# Patient Record
Sex: Male | Born: 1990 | Race: White | Hispanic: No | Marital: Single | Smoking: Current some day smoker
Health system: Southern US, Community
[De-identification: ages and names within clinical notes are randomized; demographics above are authoritative.]

## PROBLEM LIST (undated history)

## (undated) DIAGNOSIS — S83209A Unspecified tear of unspecified meniscus, current injury, unspecified knee, initial encounter: Secondary | ICD-10-CM

## (undated) DIAGNOSIS — K429 Umbilical hernia without obstruction or gangrene: Secondary | ICD-10-CM

## (undated) HISTORY — PX: WISDOM TOOTH EXTRACTION: SHX21

---

## 2016-08-29 ENCOUNTER — Other Ambulatory Visit: Payer: Self-pay | Admitting: Family Medicine

## 2016-08-29 DIAGNOSIS — Z77018 Contact with and (suspected) exposure to other hazardous metals: Secondary | ICD-10-CM

## 2016-08-29 DIAGNOSIS — M25562 Pain in left knee: Secondary | ICD-10-CM

## 2016-09-02 ENCOUNTER — Other Ambulatory Visit: Payer: Self-pay

## 2016-09-07 ENCOUNTER — Other Ambulatory Visit: Payer: Self-pay

## 2016-09-14 ENCOUNTER — Encounter (INDEPENDENT_AMBULATORY_CARE_PROVIDER_SITE_OTHER): Payer: Self-pay | Admitting: Orthopedic Surgery

## 2016-09-14 ENCOUNTER — Ambulatory Visit (INDEPENDENT_AMBULATORY_CARE_PROVIDER_SITE_OTHER): Payer: BC Managed Care – PPO | Admitting: Orthopedic Surgery

## 2016-09-14 ENCOUNTER — Ambulatory Visit (INDEPENDENT_AMBULATORY_CARE_PROVIDER_SITE_OTHER): Payer: Self-pay

## 2016-09-14 DIAGNOSIS — M25562 Pain in left knee: Secondary | ICD-10-CM | POA: Diagnosis not present

## 2016-09-14 DIAGNOSIS — G8929 Other chronic pain: Secondary | ICD-10-CM

## 2016-09-14 NOTE — Progress Notes (Signed)
Office Visit Note   Patient: David Patrick           Date of Birth: 17-Feb-1991           MRN: [id number] Visit Date: 09/14/2016 Requested by: Blair Heys, MD [address] [place], [place] [address] PCP: Thora Lance, MD  Subjective: Chief Complaint  Patient presents with  . 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  He describes developing a mass on the posterior medial aspect of his knee.  He does do a lot of knee flexion activities.  He wears a brace which helps.  He describes weakness with occasional buckling.  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  Not taking any medication for the problem.  He states it has been bothering him significantly over the past 2 months.  He's to skateboard but doesn't do that much anymore.  He's not had any recent travel but he is a smoker              Review of Systems All systems reviewed are negative as they relate to the chief complaint within the history of present illness.  Patient denies  fevers or chills.    Assessment & Plan: Visit Diagnoses:  1. Chronic pain of left knee     Plan: Impression is left knee pain with likely medial meniscal tear with meniscal cyst.  This is generally confirmed with ultrasound.  I would like to get an MRI scan if possible to further confirm the nature of the meniscal tear and its repairability.  I think he is likely heading for meniscal cyst excision and partial meniscectomy with cars and tunnel cleavage tear closure.  The cyst does have septations but does not appear to have much in terms of a solid component.  I'll see him back after the MRI scan.  I do want to order I x-rays prior to obtaining the scan because of his exposure to metal in his sculpting work  Follow-Up Instructions: No Follow-up on file.   Orders:  Orders Placed This Encounter  Procedures  .  XR KNEE 3 VIEW LEFT   No orders of the defined types were placed in this encounter.     Procedures: No procedures performed      Clinical Data: No additional findings.  Objective: Vital Signs: There were no vitals taken for this visit.  Physical Exam   Constitutional: Patient appears well-developed HEENT:  Head: Normocephalic Eyes:EOM are normal Neck: Normal range of motion Cardiovascular: Normal rate Pulmonary/chest: Effort normal Neurologic: Patient is alert Skin: Skin is warm Psychiatric: Patient has normal mood and affect    Ortho Exam examination of the left knee demonstrates no effusion full range of motion intact extensor mechanism palpable pedal pulses he does have Walnut-sized mass posterior medial aspect of the knee at the level of the joint line and collateral crucial ligaments are stable femur compression testing equivocal no groin pain with internal/external rotation of the leg no other masses lymph adenopathy or skin changes noted other than that posterior medial mass which is cystic in nature  Specialty Comments:  No specialty comments available.  Imaging: Xr Knee 3 View Left  Result Date: 09/14/2016 AP lateral merchant left knee reviewed.  Patellofemoral joint maintained with no abnormal patellar tracking mechanics no arthritis is present alignment normal  no other soft tissue calcifications.  Joint space is maintained medial lateral side.    PMFS History: There are no active problems to display for this patient.  No past medical history on file.  No family history on file.  No past surgical history on file. Social History   Occupational History  . Not on file.   Social History Main Topics  . Smoking status: Current Every Day Smoker  . Smokeless tobacco: Never Used  . Alcohol use Yes  . Drug use: Unknown  . Sexual activity: Not on file

## 2016-09-26 ENCOUNTER — Other Ambulatory Visit (INDEPENDENT_AMBULATORY_CARE_PROVIDER_SITE_OTHER): Payer: Self-pay | Admitting: Orthopedic Surgery

## 2016-09-26 DIAGNOSIS — Z77018 Contact with and (suspected) exposure to other hazardous metals: Secondary | ICD-10-CM

## 2016-09-28 ENCOUNTER — Ambulatory Visit
Admission: RE | Admit: 2016-09-28 | Discharge: 2016-09-28 | Disposition: A | Discharge status: Home / Self Care | Payer: BC Managed Care – PPO | Source: Ambulatory Visit | Attending: Orthopedic Surgery | Admitting: Orthopedic Surgery

## 2016-09-28 ENCOUNTER — Ambulatory Visit (INDEPENDENT_AMBULATORY_CARE_PROVIDER_SITE_OTHER): Payer: BC Managed Care – PPO | Admitting: Orthopedic Surgery

## 2016-09-28 DIAGNOSIS — Z77018 Contact with and (suspected) exposure to other hazardous metals: Secondary | ICD-10-CM

## 2016-09-28 DIAGNOSIS — M25562 Pain in left knee: Principal | ICD-10-CM

## 2016-09-28 DIAGNOSIS — G8929 Other chronic pain: Secondary | ICD-10-CM

## 2016-10-03 ENCOUNTER — Encounter (INDEPENDENT_AMBULATORY_CARE_PROVIDER_SITE_OTHER): Payer: Self-pay | Admitting: Orthopedic Surgery

## 2016-10-03 ENCOUNTER — Ambulatory Visit (INDEPENDENT_AMBULATORY_CARE_PROVIDER_SITE_OTHER): Payer: BC Managed Care – PPO | Admitting: Orthopedic Surgery

## 2016-10-03 DIAGNOSIS — S83242A Other tear of medial meniscus, current injury, left knee, initial encounter: Secondary | ICD-10-CM | POA: Diagnosis not present

## 2016-10-03 NOTE — Progress Notes (Signed)
   Office Visit Note   Patient: David Patrick           Date of Birth: [birth year]           MRN: [id number] Visit Date: 10/03/2016 Requested by: Blair Heysobert Ehinger, MD [address] [place], [place] [address] PCP: Thora LanceEHINGER,ROBERT R, MD  Subjective: Chief Complaint  Patient presents with  . Left Knee - Pain, Follow-up    HPI David Patrick is a 26 year old patient with left knee pain.  Since that seems had an MRI scan which is reviewed with him today.  Does show horizontal cleavage-type medial meniscal tear with a very large meniscal cyst which is anterior and posterior to his MCL and extending a little bit posterior to the PCL.  He does do sculpting teaching along with skateboarding.  He denies much in the way of mechanical symptoms but does report pain.              Review of Systems All systems reviewed are negative as they relate to the chief complaint within the history of present illness.  Patient denies  fevers or chills.    Assessment & Plan: Visit Diagnoses:  1. Acute medial meniscus tear of left knee, initial encounter     Plan: Impression is left knee pain with medial meniscal cyst.  This is a ganglion cyst.  Plan is surgical decompression when he feels like he wants to do that.  We talked about needle aspiration today under ultrasound guidance but he wants to avoid that intervention.  I think in general he would need arthroscopy followed by open excision of that ganglion cyst and suture closure of the meniscal tear from the posterior capsular aspect of the knee.  Time out of work discussed risks and benefits discussed he'll consider his options and call to schedule if he wants to.  Otherwise we will see him back as needed  Follow-Up Instructions: No Follow-up on file.   Orders:  No orders of the defined types were placed in this encounter.  No orders of the defined types were placed in this encounter.     Procedures: No procedures performed   Clinical Data: No  additional findings.  Objective: Vital Signs: There were no vitals taken for this visit.  Physical Exam   Constitutional: Patient appears well-developed HEENT:  Head: Normocephalic Eyes:EOM are normal Neck: Normal range of motion Cardiovascular: Normal rate Pulmonary/chest: Effort normal Neurologic: Patient is alert Skin: Skin is warm Psychiatric: Patient has normal mood and affect    Ortho Exam left knee demonstrates palpable cystic structure posterior medial joint line and range of motion is full there is no effusion since her mechanism is intact collateral cruciate ligaments are stable pedal pulses palpable no other masses lymph and every skin changes noted in the left knee region  Specialty Comments:  No specialty comments available.  Imaging: No results found.   PMFS History: Patient Active Problem List   Diagnosis Date Noted  . Acute medial meniscus tear of left knee 10/03/2016   No past medical history on file.  No family history on file.  No past surgical history on file. Social History   Occupational History  . Not on file.   Social History Main Topics  . Smoking status: Current Every Day Smoker  . Smokeless tobacco: Never Used  . Alcohol use Yes  . Drug use: Unknown  . Sexual activity: Not on file

## 2017-09-26 IMAGING — MR MR KNEE*L* W/O CM
5 of 6 series · 33 of 40 positions shown · non-contrast
Comparison: Knee radiographs 09/14/2016

CLINICAL DATA: Chronic knee pain worsening over the past few
months. Palpable joint line abnormality.

EXAM:
MRI OF THE LEFT KNEE WITHOUT CONTRAST
TECHNIQUE: Multiplanar, multisequence MR imaging of the knee was performed. No
intravenous contrast was administered.

[Series 6: PD fat-sat · axial · left · 3.0mm · 0.39mm/px · z∈[-35,+78]mm · 8 of 36 slices shown (1 of 3)]
[im 1/36]
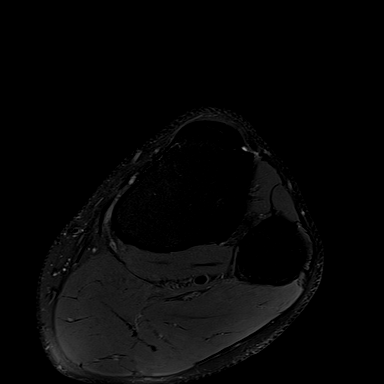
[im 6/36]
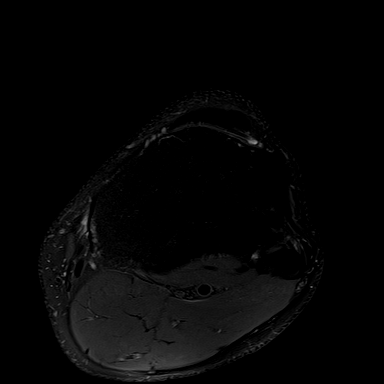
[im 11/36]
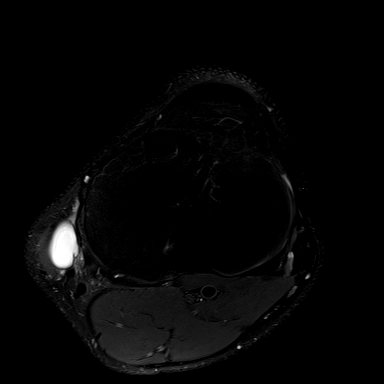
[im 16/36]
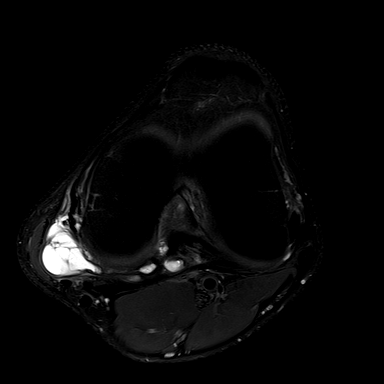
[im 21/36]
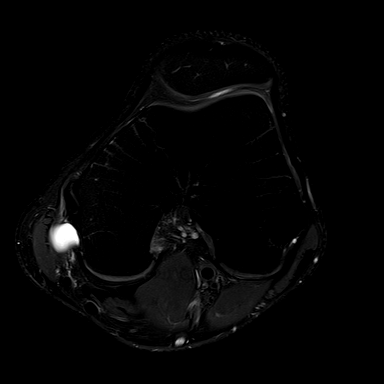
[im 26/36]
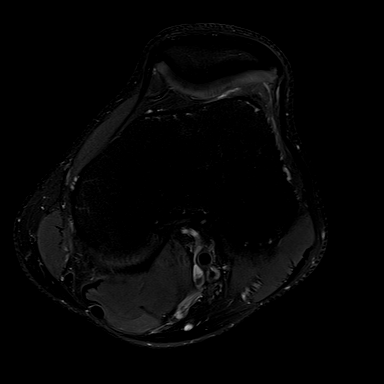
[im 31/36]
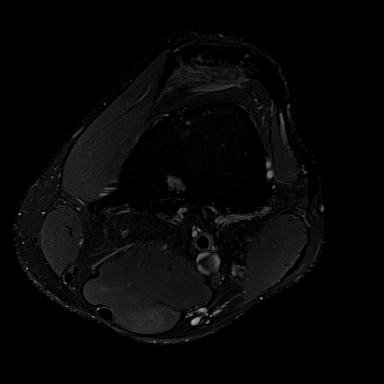
[im 36/36]
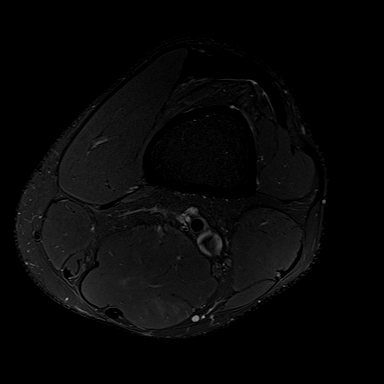

[Series 8: PD fat-sat · coronal · left · 3.0mm · 0.33mm/px · 7 of 33 slices shown (2 of 3)]
[im 1/33]
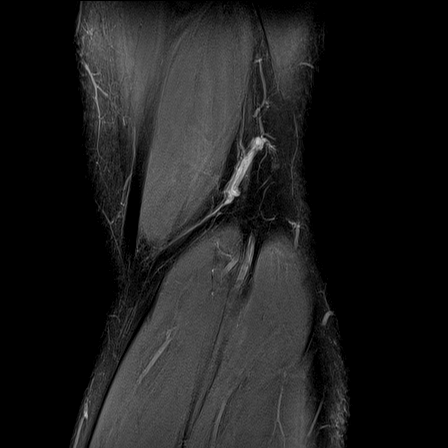
[im 6/33]
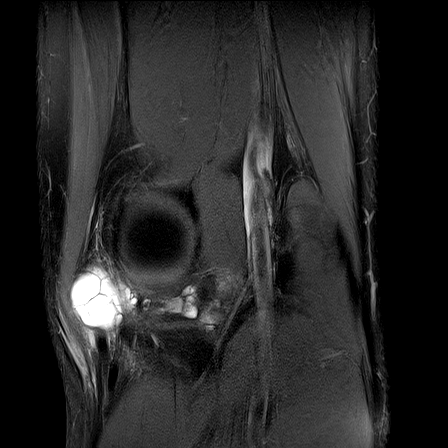
[im 11/33]
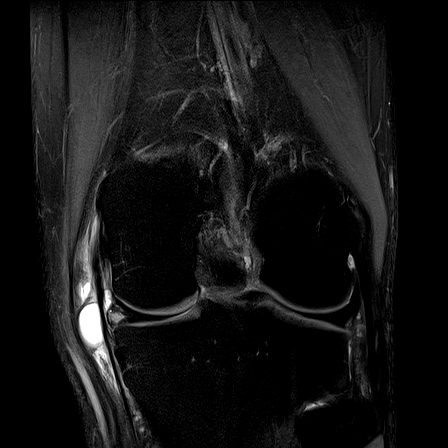
[im 17/33]
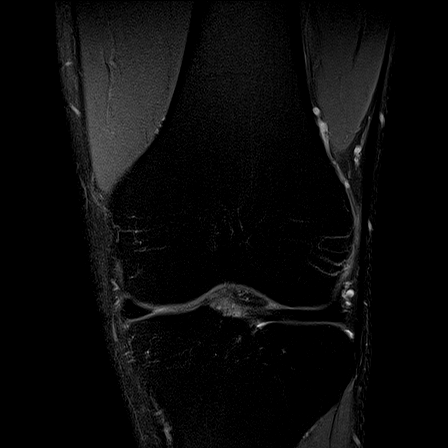
[im 22/33]
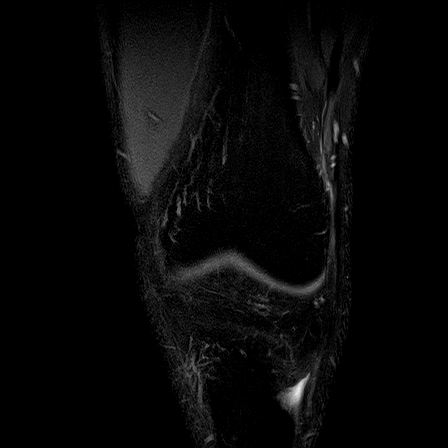
[im 27/33]
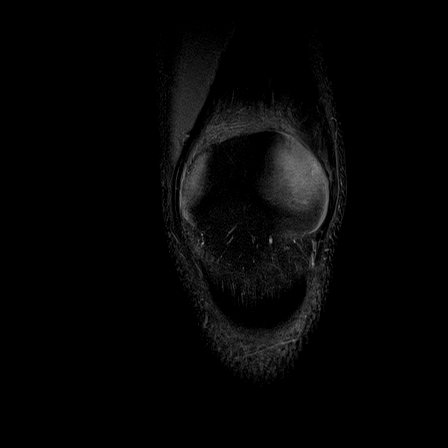
[im 33/33]
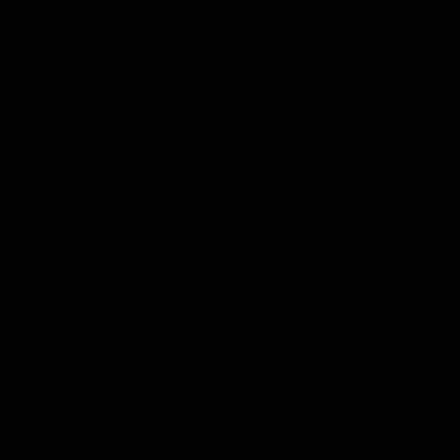

[Series 9: PD fat-sat · sagittal · left · 3.0mm · 0.39mm/px · 6 of 27 slices shown (3 of 3)]
[im 1/27]
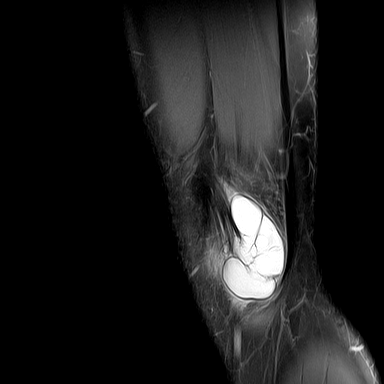
[im 6/27]
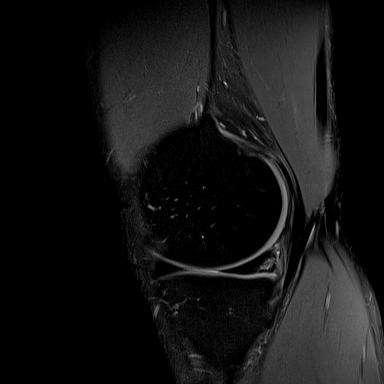
[im 11/27]
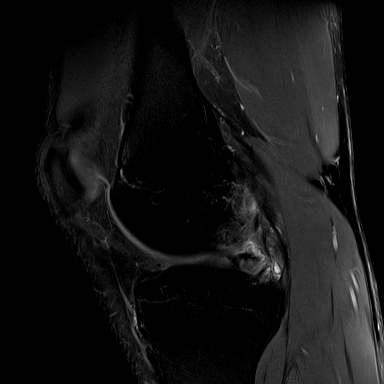
[im 16/27]
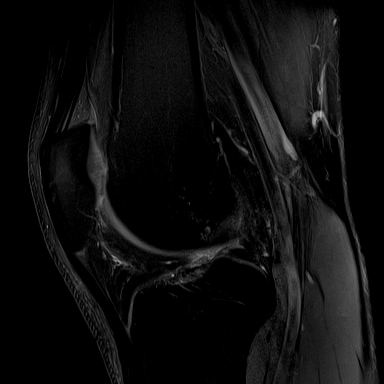
[im 21/27]
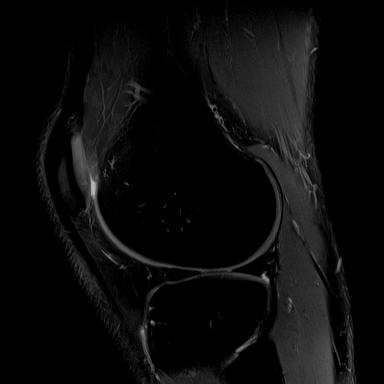
[im 27/27]
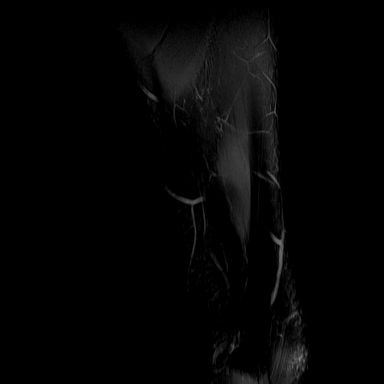

[Series 10: T2 fat-sat · coronal · left · 3.0mm · 0.39mm/px · 7 of 33 slices shown]
[im 1/33]
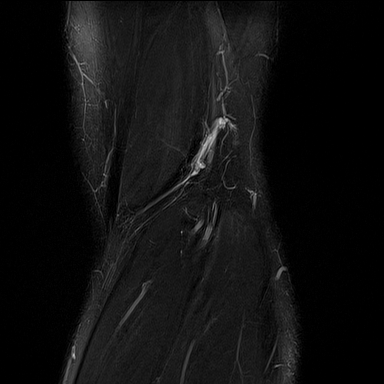
[im 6/33]
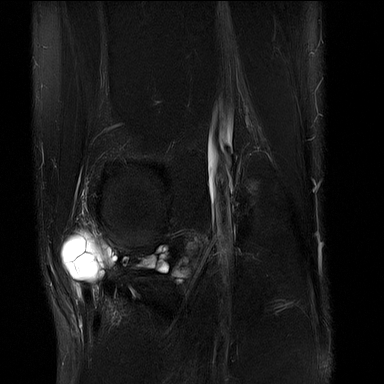
[im 11/33]
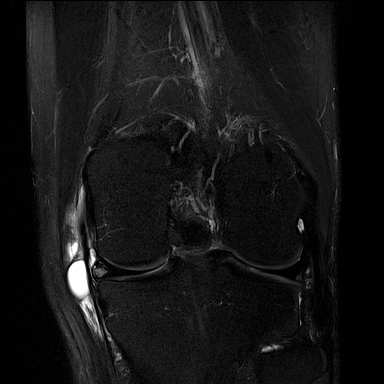
[im 17/33]
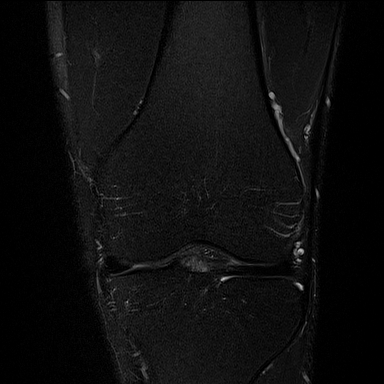
[im 22/33]
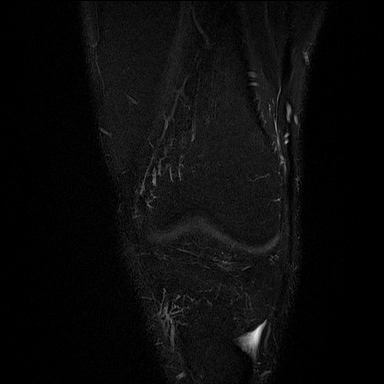
[im 27/33]
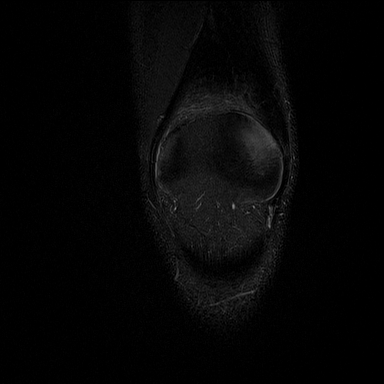
[im 33/33]
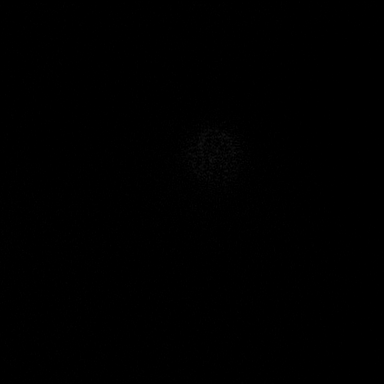

[Series 11: PD · oblique · left · 1.5mm · 0.44mm/px · 5 of 21 slices shown]
[im 1/21]
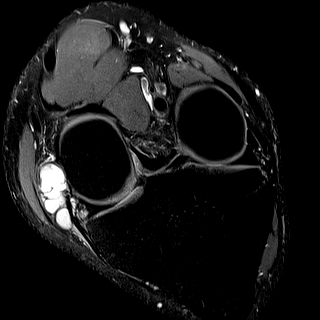
[im 6/21]
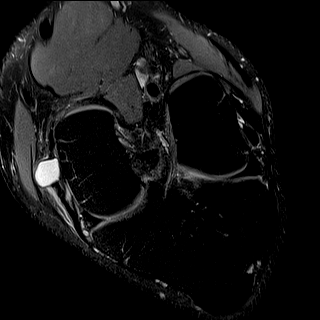
[im 11/21]
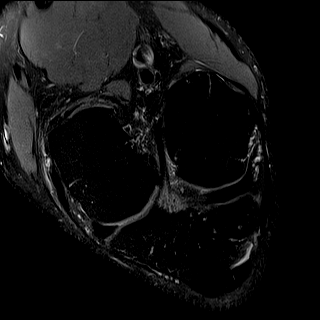
[im 16/21]
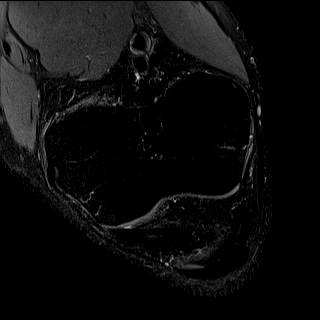
[im 21/21]
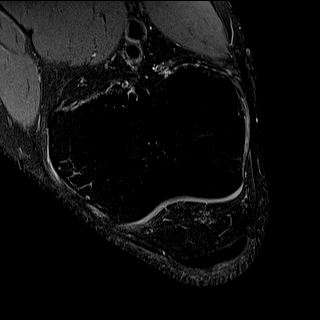

[33 of 40 positions shown; findings below may reference images not displayed]

FINDINGS: MENISCI

Medial meniscus: Fairly extensive inferior articular surface tear
involving the posterior horn extending up into the mid body region.
There is intrameniscal cyst formation and a large parameniscal cyst.
The medial component of the cyst measures approximately 3.8 x
cm. There is also a central component near the PCL which is smaller.

Lateral meniscus:  Intact

LIGAMENTS

Cruciates:  Intact.  Mild mucoid degeneration of the ACL.

Collaterals:  Intact.

CARTILAGE

Patellofemoral:  Normal

Medial:  Normal

Lateral:  Normal

Joint:  No joint effusion.

Popliteal Fossa:  No popliteal mass or Baker's cyst.

Extensor Mechanism: The patella retinacular structures are intact
and the quadriceps and patellar tendons are intact.

Bones:  No acute bony findings.

Other: Normal knee musculature.
IMPRESSION: 1. Fairly extensive inferior articular surface tear involving the
posterior horn of the medial meniscus extending up into the mid body
region. There is intrameniscal cyst formation and a large
parameniscal cyst.
2. Intact ligamentous structures and no acute bony findings. Mild
mucoid degeneration of the ACL.
3. Normal articular cartilage.
4. No joint effusion or Baker's cyst.

## 2018-01-04 ENCOUNTER — Other Ambulatory Visit: Payer: Self-pay | Admitting: Family Medicine

## 2018-01-04 ENCOUNTER — Encounter (INDEPENDENT_AMBULATORY_CARE_PROVIDER_SITE_OTHER): Payer: Self-pay | Admitting: Surgery

## 2018-01-04 ENCOUNTER — Ambulatory Visit
Admission: RE | Admit: 2018-01-04 | Discharge: 2018-01-04 | Disposition: A | Discharge status: Home / Self Care | Payer: BC Managed Care – PPO | Source: Ambulatory Visit | Attending: Family Medicine | Admitting: Family Medicine

## 2018-01-04 ENCOUNTER — Ambulatory Visit (INDEPENDENT_AMBULATORY_CARE_PROVIDER_SITE_OTHER): Payer: BC Managed Care – PPO | Admitting: Surgery

## 2018-01-04 DIAGNOSIS — S93401A Sprain of unspecified ligament of right ankle, initial encounter: Secondary | ICD-10-CM

## 2018-01-04 DIAGNOSIS — S82891A Other fracture of right lower leg, initial encounter for closed fracture: Secondary | ICD-10-CM | POA: Diagnosis not present

## 2018-01-11 ENCOUNTER — Ambulatory Visit (INDEPENDENT_AMBULATORY_CARE_PROVIDER_SITE_OTHER): Payer: BC Managed Care – PPO

## 2018-01-11 ENCOUNTER — Ambulatory Visit (INDEPENDENT_AMBULATORY_CARE_PROVIDER_SITE_OTHER): Payer: BC Managed Care – PPO | Admitting: Orthopedic Surgery

## 2018-01-11 ENCOUNTER — Encounter (INDEPENDENT_AMBULATORY_CARE_PROVIDER_SITE_OTHER): Payer: Self-pay | Admitting: Orthopedic Surgery

## 2018-01-11 DIAGNOSIS — M25571 Pain in right ankle and joints of right foot: Secondary | ICD-10-CM | POA: Diagnosis not present

## 2018-01-11 NOTE — Progress Notes (Signed)
   Post-Op Visit Note   Patient: David FountainDane Lyles           Date of Birth: 1991/05/25           MRN: [id number] Visit Date: 01/11/2018 PCP: Blair HeysEhinger, Robert, MD   Assessment & Plan:  Chief Complaint:  Chief Complaint  Patient presents with  . Right Ankle - Follow-up, Fracture   Visit Diagnoses:  1. Pain in right ankle and joints of right foot     Plan: [name] presents for follow-up of right t ankle lateral malleolar fracture.  He is been doing well.  On exam he has no medial sided tenderness.  No calf tenderness to palpation.  Radiographs show no change in fracture alignment.  Plan at this time is to continue with baby aspirin 1 by mouth twice a day.  Continue with ibuprofen for pain.  Short leg cast applied.  Continue nonweightbearing and elevation.  I will see him back in 3 weeks repeat radiographs and likely initiation of some weightbearing in a fracture boot at that time.  Follow-Up Instructions: Return in about 3 years (around 01/11/2021).   Orders:  Orders Placed This Encounter  Procedures  . XR Ankle Complete Right   No orders of the defined types were placed in this encounter.   Imaging: Xr Ankle Complete Right  Result Date: 01/11/2018 AP lateral mortise right ankle reviewed.  Minimally displaced lateral malleolar fracture is present.  Mortise is stable and symmetric.  No other acute fractures present.   PMFS History: Patient Active Problem List   Diagnosis Date Noted  . Acute medial meniscus tear of left knee 10/03/2016   History reviewed. No pertinent past medical history.  History reviewed. No pertinent family history.  History reviewed. No pertinent surgical history. Social History   Occupational History  . Not on file  Tobacco Use  . Smoking status: Current Every Day Smoker  . Smokeless tobacco: Never Used  Substance and Sexual Activity  . Alcohol use: Yes  . Drug use: Not on file  . Sexual activity: Not on file

## 2018-01-26 ENCOUNTER — Ambulatory Visit (INDEPENDENT_AMBULATORY_CARE_PROVIDER_SITE_OTHER): Payer: BC Managed Care – PPO

## 2018-01-26 ENCOUNTER — Ambulatory Visit (INDEPENDENT_AMBULATORY_CARE_PROVIDER_SITE_OTHER): Payer: BC Managed Care – PPO | Admitting: Orthopedic Surgery

## 2018-01-26 ENCOUNTER — Encounter (INDEPENDENT_AMBULATORY_CARE_PROVIDER_SITE_OTHER): Payer: Self-pay | Admitting: Orthopedic Surgery

## 2018-01-26 DIAGNOSIS — M25571 Pain in right ankle and joints of right foot: Secondary | ICD-10-CM

## 2018-01-26 NOTE — Progress Notes (Signed)
   Post-Op Visit Note   Patient: David Patrick           Date of Birth: [birth year]           MRN: [id number] Visit Date: 01/26/2018 PCP: Blair HeysEhinger, Robert, MD   Assessment & Plan:  Chief Complaint:  Chief Complaint  Patient presents with  . Right Ankle - Fracture, Follow-up   Visit Diagnoses:  1. Pain in right ankle and joints of right foot     Plan: [name] is a patient with right ankle fracture.  He is been doing well.  On exam he has mild tenderness on the lateral side.  No calf tenderness is present.  Range of motion is improving.  Radiographs look good.  Plan is to put him into a fracture boot weightbearing as tolerated.  Start ankle range of motion exercises.  Come back in 2-1/2 weeks repeat radiographs and likely change out to regular shoes at that time.  Follow-Up Instructions: No follow-ups on file.   Orders:  Orders Placed This Encounter  Procedures  . XR Ankle Complete Right   No orders of the defined types were placed in this encounter.   Imaging: Xr Ankle Complete Right  Result Date: 01/26/2018 AP lateral mortise right ankle reviewed.  Lateral malleolar fracture observed.  No change in fracture displacement is present.  Mortise is symmetric.  Minimal amount of callus formation present at the superior aspect of the spike of the distal fibular fragment   PMFS History: Patient Active Problem List   Diagnosis Date Noted  . Acute medial meniscus tear of left knee 10/03/2016   History reviewed. No pertinent past medical history.  History reviewed. No pertinent family history.  History reviewed. No pertinent surgical history. Social History   Occupational History  . Not on file  Tobacco Use  . Smoking status: Current Every Day Smoker  . Smokeless tobacco: Never Used  Substance and Sexual Activity  . Alcohol use: Yes  . Drug use: Not on file  . Sexual activity: Not on file

## 2018-01-31 ENCOUNTER — Ambulatory Visit (INDEPENDENT_AMBULATORY_CARE_PROVIDER_SITE_OTHER): Payer: BC Managed Care – PPO | Admitting: Orthopedic Surgery

## 2018-02-12 ENCOUNTER — Ambulatory Visit (INDEPENDENT_AMBULATORY_CARE_PROVIDER_SITE_OTHER): Payer: BC Managed Care – PPO | Admitting: Orthopedic Surgery

## 2018-02-14 ENCOUNTER — Ambulatory Visit (INDEPENDENT_AMBULATORY_CARE_PROVIDER_SITE_OTHER): Payer: BC Managed Care – PPO | Admitting: Orthopedic Surgery

## 2018-02-19 ENCOUNTER — Encounter (INDEPENDENT_AMBULATORY_CARE_PROVIDER_SITE_OTHER): Payer: Self-pay | Admitting: Surgery

## 2018-02-19 NOTE — Progress Notes (Signed)
   Office Visit Note   Patient: David FountainDane Patrick           Date of Birth: 1991-05-02           MRN: [id number] Visit Date: 01/04/2018              Requested by: Blair HeysEhinger, Robert, MD [address] [place], [place] [address] PCP: Blair HeysEhinger, Robert, MD   Assessment & Plan: Visit Diagnoses:  1. Closed fracture of right ankle, initial encounter     Plan: Patient was put in a splint today.  Instructed to be nonweightbearing with crutches.  Elevate foot as much as possible to help decrease pain and swelling.  Follow-up with Dr. August Saucerean in 1 week for recheck.  Stressed to him the importance of being compliant with nonweightbearing restrictions.  Follow-Up Instructions: Return in about 1 week (around 01/11/2018) for with Dr August Saucerean.   Orders:  No orders of the defined types were placed in this encounter.  No orders of the defined types were placed in this encounter.     Procedures: No procedures performed   Clinical Data: No additional findings.   Subjective: Chief Complaint  Patient presents with  . Right Ankle - Pain    HPI 27 year old white male comes in today with complaints of right ankle pain.  States that he was skateboarding last night when he fell rolling his ankle.  [facility] with [facility] fibula fracture.  They did not instruct him to be nonweightbearing and they also did not splint him. Review of Systems No current cardiopulmonary GI GU issues  Objective: Vital Signs: There were no vitals taken for this visit.  Physical Exam  Constitutional: He is oriented to person, place, and time. He appears well-nourished. No distress.  HENT:  Head: Normocephalic and atraumatic.  Eyes: Pupils are equal, round, and reactive to light. EOM are normal.  Pulmonary/Chest: Effort normal. No respiratory distress.  Musculoskeletal:  Right ankle and foot there is swelling.  Some lateral ankle bruising.  Obviously tender over the lateral malleolus.   Neurovascular intact.  Calf nontender.  Neurological: He is alert and oriented to person, place, and time.  Skin: Skin is warm and dry.  Psychiatric: He has a normal mood and affect.    Ortho Exam  Specialty Comments:  No specialty comments available.  Imaging: No results found.   PMFS History: Patient Active Problem List   Diagnosis Date Noted  . Acute medial meniscus tear of left knee 10/03/2016   History reviewed. No pertinent past medical history.  History reviewed. No pertinent family history.  History reviewed. No pertinent surgical history. Social History   Occupational History  . Not on file  Tobacco Use  . Smoking status: Current Every Day Smoker  . Smokeless tobacco: Never Used  Substance and Sexual Activity  . Alcohol use: Yes  . Drug use: Not on file  . Sexual activity: Not on file

## 2018-02-21 ENCOUNTER — Encounter (INDEPENDENT_AMBULATORY_CARE_PROVIDER_SITE_OTHER): Payer: Self-pay | Admitting: Orthopedic Surgery

## 2018-02-21 ENCOUNTER — Ambulatory Visit (INDEPENDENT_AMBULATORY_CARE_PROVIDER_SITE_OTHER): Payer: BC Managed Care – PPO | Admitting: Orthopedic Surgery

## 2018-02-21 ENCOUNTER — Ambulatory Visit (INDEPENDENT_AMBULATORY_CARE_PROVIDER_SITE_OTHER): Payer: Self-pay

## 2018-02-21 DIAGNOSIS — M25571 Pain in right ankle and joints of right foot: Secondary | ICD-10-CM

## 2018-02-21 DIAGNOSIS — S82891A Other fracture of right lower leg, initial encounter for closed fracture: Secondary | ICD-10-CM

## 2018-02-25 ENCOUNTER — Encounter (INDEPENDENT_AMBULATORY_CARE_PROVIDER_SITE_OTHER): Payer: Self-pay | Admitting: Orthopedic Surgery

## 2018-02-25 NOTE — Progress Notes (Signed)
   Post-Op Visit Note   Patient: David Patrick           Date of Birth: Feb 11, 1991           MRN: [id number] Visit Date: 02/21/2018 PCP: Blair HeysEhinger, Robert, MD   Assessment & Plan:  Chief Complaint:  Chief Complaint  Patient presents with  . Right Ankle - Follow-up, Fracture   Visit Diagnoses:  1. Pain in right ankle and joints of right foot   2. Closed fracture of right ankle, initial encounter     Plan: [name] presents for follow-up of right ankle fracture.  Date of injury 01/04/2018.  He is getting better.  He is been fully weightbearing in a regular shoe.  He likes to skateboard.  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  Radiographs show good fracture alignment with some callus formation.  Plan is to continue with range of motion exercises and no running or skateboarding for 6 more weeks.  Follow-up with me as needed.  Follow-Up Instructions: Return if symptoms worsen or fail to improve.   Orders:  Orders Placed This Encounter  Procedures  . XR Ankle Complete Right   No orders of the defined types were placed in this encounter.   Imaging: No results found.  PMFS History: Patient Active Problem List   Diagnosis Date Noted  . Acute medial meniscus tear of left knee 10/03/2016   History reviewed. No pertinent past medical history.  History reviewed. No pertinent family history.  History reviewed. No pertinent surgical history. Social History   Occupational History  . Not on file  Tobacco Use  . Smoking status: Current Every Day Smoker  . Smokeless tobacco: Never Used  Substance and Sexual Activity  . Alcohol use: Yes  . Drug use: Not on file  . Sexual activity: Not on file

## 2019-07-31 ENCOUNTER — Ambulatory Visit: Payer: BC Managed Care – PPO | Attending: Internal Medicine

## 2019-07-31 DIAGNOSIS — Z20822 Contact with and (suspected) exposure to covid-19: Secondary | ICD-10-CM

## 2019-08-01 LAB — NOVEL CORONAVIRUS, NAA: SARS-CoV-2, NAA: NOT DETECTED

## 2019-08-12 ENCOUNTER — Ambulatory Visit: Payer: BC Managed Care – PPO

## 2019-08-12 ENCOUNTER — Ambulatory Visit: Payer: BC Managed Care – PPO | Attending: Internal Medicine

## 2019-08-12 DIAGNOSIS — Z20822 Contact with and (suspected) exposure to covid-19: Secondary | ICD-10-CM

## 2019-08-13 LAB — NOVEL CORONAVIRUS, NAA: SARS-CoV-2, NAA: NOT DETECTED

## 2019-08-19 ENCOUNTER — Other Ambulatory Visit: Payer: Self-pay

## 2019-08-19 ENCOUNTER — Encounter: Payer: Self-pay | Admitting: Orthopedic Surgery

## 2019-08-19 ENCOUNTER — Ambulatory Visit: Payer: BC Managed Care – PPO | Admitting: Orthopedic Surgery

## 2019-08-19 ENCOUNTER — Ambulatory Visit: Payer: Self-pay

## 2019-08-19 VITALS — Ht 71.0 in | Wt 165.0 lb

## 2019-08-19 DIAGNOSIS — G8929 Other chronic pain: Secondary | ICD-10-CM

## 2019-08-19 DIAGNOSIS — M25562 Pain in left knee: Secondary | ICD-10-CM

## 2019-08-25 ENCOUNTER — Encounter: Payer: Self-pay | Admitting: Orthopedic Surgery

## 2019-08-25 NOTE — Progress Notes (Signed)
Office Visit Note   Patient: David Patrick           Date of Birth: [birth year]           MRN: [id number] Visit Date: 08/19/2019 Requested by: Blair Heys, MD [address] [place],  [place] [address] PCP: Blair Heys, MD  Subjective: Chief Complaint  Patient presents with  . Left Knee - Pain    HPI: David Patrick is a patient with left knee pain.  He was seen about a year and a half ago for the left knee.  He was noted to have torn meniscus and meniscal cyst on the medial side.  We discussed surgery at that time but he wanted to hold off.  Now he would like to schedule surgery because he reports continued soreness and the cyst has become larger with overuse.  He can always tell that it is there.  He has failed conservative management.              ROS: All systems reviewed are negative as they relate to the chief complaint within the history of present illness.  Patient denies  fevers or chills.   Assessment & Plan: Visit Diagnoses:  1. Chronic pain of left knee     Plan: Impression is meniscal tear and left knee meniscal cyst.  Plan is arthroscopy with meniscal debridement and cyst excision with meniscal repair either all inside or capsule repair from the outside.  The risk and benefits of surgery discussed include not limited to infection nerve vessel damage recurrence of the cyst prolonged pain as well as knee dysfunction.  Patient understands the risk benefits and wishes to proceed.  All questions answered.  Follow-Up Instructions: No follow-ups on file.   Orders:  Orders Placed This Encounter  Procedures  . XR KNEE 3 VIEW LEFT   No orders of the defined types were placed in this encounter.     Procedures: No procedures performed   Clinical Data: No additional findings.  Objective: Vital Signs: Ht 5\' 11"  (1.803 m)   Wt 165 lb (74.8 kg)   BMI 23.01 kg/m   Physical Exam:   Constitutional: Patient appears well-developed HEENT:  Head:  Normocephalic Eyes:EOM are normal Neck: Normal range of motion Cardiovascular: Normal rate Pulmonary/chest: Effort normal Neurologic: Patient is alert Skin: Skin is warm Psychiatric: Patient has normal mood and affect    Ortho Exam: Ortho exam demonstrates full active and passive range of motion of that left knee.  He does have a grape-sized cystic mass on the posterior medial aspect of the knee at the level of the joint line.  Consistent with known meniscal cyst from MRI scan approximately a year and a half ago.  Trace effusion in the left knee with intact extensor mechanism.  Collateral and cruciate ligaments are stable.  Patient has no personal or family history of DVT or pulmonary embolism.  Specialty Comments:  No specialty comments available.  Imaging: No results found.   PMFS History: Patient Active Problem List   Diagnosis Date Noted  . Acute medial meniscus tear of left knee 10/03/2016   No past medical history on file.  No family history on file.  No past surgical history on file. Social History   Occupational History  . Not on file  Tobacco Use  . Smoking status: Current Every Day Smoker  . Smokeless tobacco: Never Used  Substance and Sexual Activity  . Alcohol use: Yes  . Drug use: Not on file  .  Sexual activity: Not on file

## 2019-10-02 ENCOUNTER — Ambulatory Visit: Payer: BC Managed Care – PPO | Attending: Internal Medicine

## 2019-10-02 DIAGNOSIS — Z20822 Contact with and (suspected) exposure to covid-19: Secondary | ICD-10-CM

## 2019-10-03 LAB — NOVEL CORONAVIRUS, NAA: SARS-CoV-2, NAA: NOT DETECTED

## 2019-10-07 ENCOUNTER — Telehealth: Payer: Self-pay | Admitting: Orthopedic Surgery

## 2019-10-07 ENCOUNTER — Encounter: Payer: Self-pay | Admitting: Orthopedic Surgery

## 2019-10-07 ENCOUNTER — Other Ambulatory Visit: Payer: Self-pay | Admitting: Surgical

## 2019-10-07 DIAGNOSIS — M23032 Cystic meniscus, other medial meniscus, left knee: Secondary | ICD-10-CM

## 2019-10-07 MED ORDER — METHOCARBAMOL 500 MG PO TABS: 500.0000 mg | ORAL_TABLET | Freq: Three times a day (TID) | ORAL | 0 refills | Status: DC | PRN

## 2019-10-07 MED ORDER — OXYCODONE HCL 5 MG PO TABS: 5.0000 mg | ORAL_TABLET | ORAL | 0 refills | Status: AC | PRN

## 2019-10-07 MED ORDER — ASPIRIN EC 81 MG PO TBEC: 81.0000 mg | DELAYED_RELEASE_TABLET | Freq: Every day | ORAL | 0 refills | Status: AC

## 2019-10-07 NOTE — Telephone Encounter (Signed)
Pls advise. Thanks.  

## 2019-10-07 NOTE — Telephone Encounter (Signed)
  Patient called wanting to know was he supposed to get antibiotics with other medication. Was told prior to surgery he may get antibiotics. Just had surgery this a.m.  Please call patient at [phone]

## 2019-10-07 NOTE — Telephone Encounter (Signed)
No abx needed.  Had IV abx prior to surgery.  All good

## 2019-10-08 ENCOUNTER — Telehealth: Payer: Self-pay | Admitting: Orthopedic Surgery

## 2019-10-08 NOTE — Telephone Encounter (Signed)
Tried calling patient. LMVM advising per Leane Call response.

## 2019-10-08 NOTE — Telephone Encounter (Signed)
IC s/w patient and advised. He also wanted to know what exercises he should be doing?

## 2019-10-08 NOTE — Telephone Encounter (Signed)
Please advise about crutches. Thanks.

## 2019-10-08 NOTE — Telephone Encounter (Signed)
Continue using crutches until he can do 15 straight leg raises in a row without difficulty

## 2019-10-08 NOTE — Telephone Encounter (Signed)
Pt called in stating he had surgery on 10-07-19 with Dr. [name]; and was wondering if someone could give him a call regarding how often he should be changing the wraps and how long he'll be on crutches.   [phone]

## 2019-10-09 NOTE — Telephone Encounter (Signed)
Tried calling patient to advise per [facility]. No answer. LMVM for him to [facility] to discuss.

## 2019-10-13 ENCOUNTER — Ambulatory Visit: Payer: BC Managed Care – PPO | Attending: Internal Medicine

## 2019-10-13 DIAGNOSIS — Z23 Encounter for immunization: Secondary | ICD-10-CM | POA: Insufficient documentation

## 2019-10-13 NOTE — Progress Notes (Signed)
   Covid-19 Vaccination Clinic  Name:  David Patrick    MRN: [id number] DOB: 01-22-1991  10/13/2019  [name] was observed post Covid-19 immunization for 15 minutes without incident. He was provided with Vaccine Information Sheet and instruction to access the V-Safe system.   [name] was instructed to call 911 with any severe reactions post vaccine: Marland Kitchen Difficulty breathing  . Swelling of face and throat  . A fast heartbeat  . A bad rash all over body  . Dizziness and weakness   Immunizations Administered    Name Date Dose VIS Date Route   Pfizer COVID-19 Vaccine 10/13/2019  5:43 PM 0.3 mL 07/19/2019 Intramuscular   Manufacturer: [employer], [employer]   Lot: [id number]   NDC: [address]-3

## 2019-10-14 ENCOUNTER — Other Ambulatory Visit: Payer: Self-pay

## 2019-10-14 ENCOUNTER — Ambulatory Visit (INDEPENDENT_AMBULATORY_CARE_PROVIDER_SITE_OTHER): Payer: BC Managed Care – PPO | Admitting: Orthopedic Surgery

## 2019-10-14 ENCOUNTER — Encounter: Payer: Self-pay | Admitting: Orthopedic Surgery

## 2019-10-14 DIAGNOSIS — M25562 Pain in left knee: Secondary | ICD-10-CM

## 2019-10-14 DIAGNOSIS — G8929 Other chronic pain: Secondary | ICD-10-CM

## 2019-10-17 ENCOUNTER — Encounter: Payer: Self-pay | Admitting: Orthopedic Surgery

## 2019-10-17 NOTE — Progress Notes (Signed)
   Post-Op Visit Note   Patient: David Patrick           Date of Birth: [birth year]           MRN: [id number] Visit Date: 10/14/2019 PCP: Blair Heys, MD   Assessment & Plan:  Chief Complaint:  Chief Complaint  Patient presents with  . Left Knee - Routine Post Op   Visit Diagnoses:  1. Chronic pain of left knee     Plan: David Patrick is a patient who underwent left knee arthroscopy with open cyst excision on the left knee.  Taking oxycodone but not daily.  Taking baby aspirin daily.  No calf tenderness today with negative Homans.  Does have trace effusion.  Incisions are intact.  Sutures are DC'd Steri-Strips applied.  I like him to continue with knee range of motion exercises.  I think some of the stiffness in the back of his knee will improve.  Follow-up with me in 4 weeks for clinical recheck.  Okay to start on stationary bike for rehab.  Follow-Up Instructions: Return in about 4 weeks (around 11/11/2019).   Orders:  No orders of the defined types were placed in this encounter.  No orders of the defined types were placed in this encounter.   Imaging: No results found.  PMFS History: Patient Active Problem List   Diagnosis Date Noted  . Acute medial meniscus tear of left knee 10/03/2016   History reviewed. No pertinent past medical history.  History reviewed. No pertinent family history.  History reviewed. No pertinent surgical history. Social History   Occupational History  . Not on file  Tobacco Use  . Smoking status: Current Every Day Smoker  . Smokeless tobacco: Never Used  Substance and Sexual Activity  . Alcohol use: Yes  . Drug use: Not on file  . Sexual activity: Not on file

## 2019-11-11 ENCOUNTER — Ambulatory Visit: Payer: BC Managed Care – PPO | Admitting: Orthopedic Surgery

## 2019-11-13 ENCOUNTER — Ambulatory Visit: Payer: BC Managed Care – PPO | Attending: Internal Medicine

## 2019-11-13 DIAGNOSIS — Z23 Encounter for immunization: Secondary | ICD-10-CM

## 2019-11-13 NOTE — Progress Notes (Signed)
   Covid-19 Vaccination Clinic  Name:  David Patrick    MRN: [id number] DOB: 27-Sep-1990  11/13/2019  Mr. David Patrick was observed post Covid-19 immunization for 15 minutes without incident. He was provided with Vaccine Information Sheet and instruction to access the V-Safe system.   Mr. David Patrick was instructed to call 911 with any severe reactions post vaccine: Marland Kitchen Difficulty breathing  . Swelling of face and throat  . A fast heartbeat  . A bad rash all over body  . Dizziness and weakness   Immunizations Administered    Name Date Dose VIS Date Route   Pfizer COVID-19 Vaccine 11/13/2019  9:19 AM 0.3 mL 07/19/2019 Intramuscular   Manufacturer: [employer], [employer]   Lot: [id number]   NDC: [address]-4

## 2019-11-20 ENCOUNTER — Other Ambulatory Visit: Payer: Self-pay

## 2019-11-20 ENCOUNTER — Ambulatory Visit (INDEPENDENT_AMBULATORY_CARE_PROVIDER_SITE_OTHER): Payer: BC Managed Care – PPO | Admitting: Orthopedic Surgery

## 2019-11-20 DIAGNOSIS — Z9889 Other specified postprocedural states: Secondary | ICD-10-CM

## 2019-11-22 ENCOUNTER — Encounter: Payer: Self-pay | Admitting: Orthopedic Surgery

## 2019-11-22 NOTE — Progress Notes (Signed)
   Post-Op Visit Note   Patient: David Patrick           Date of Birth: 12-Jun-1991           MRN: [id number] Visit Date: 11/20/2019 PCP: Blair Heys, MD   Assessment & Plan:  Chief Complaint:  Chief Complaint  Patient presents with  . Left Knee - Follow-up   Visit Diagnoses:  1. Status post arthroscopy of left knee     Plan: Patient is a 29 year old male who presents s/p left knee arthroscopy with open cyst excision on 10/07/2019.  Patient is doing well with no complaints.  He is 6 weeks out from procedure.  He is not doing any physical therapy and not taking any medications.  He has excellent range of motion and full extension with flexion of the knee almost equivalent to the contralateral side.  Incisions have healed well.  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  Patient seems to be doing very well 6 weeks out from surgical procedure, plan for patient to follow-up with the office as needed.  Follow-Up Instructions: No follow-ups on file.   Orders:  No orders of the defined types were placed in this encounter.  No orders of the defined types were placed in this encounter.   Imaging: No results found.  PMFS History: Patient Active Problem List   Diagnosis Date Noted  . Acute medial meniscus tear of left knee 10/03/2016   No past medical history on file.  No family history on file.  No past surgical history on file. Social History   Occupational History  . Not on file  Tobacco Use  . Smoking status: Current Every Day Smoker  . Smokeless tobacco: Never Used  Substance and Sexual Activity  . Alcohol use: Yes  . Drug use: Not on file  . Sexual activity: Not on file

## 2019-12-11 ENCOUNTER — Ambulatory Visit: Payer: BC Managed Care – PPO | Admitting: Orthopedic Surgery

## 2019-12-11 HISTORY — PX: MENISCUS REPAIR: SHX5179

## 2020-09-14 ENCOUNTER — Ambulatory Visit: Payer: Self-pay | Admitting: Surgery

## 2020-09-14 NOTE — H&P (Signed)
History of Present Illness David Patrick. Canaan Holzer MD; 09/14/2020 12:30 PM) The patient is a 30 year old male who presents with an umbilical hernia. Referred by Dr. 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 Over the last few years this has become larger and is causing intermittent tenderness. It is not completely reducible. He denies any GI obstructive symptoms. He denies any infection or erythema of this area. The tenderness also localized at the upper edge of his umbilicus.  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   Problem List/Past Medical Molli Hazard K. 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  Allergies Marliss Coots, CNA; 09/14/2020 10:54 AM) No Known Drug Allergies [09/14/2020]: No Known Allergies [09/14/2020]:  Medication History Marliss Coots, CNA; 09/14/2020 10:54 AM) No Current Medications Medications Reconciled  Social History Marliss Coots, CNA; 09/14/2020 10:53 AM) Alcohol use Moderate alcohol use. Caffeine use Coffee. No drug use Tobacco use Former smoker.  Family History Marliss Coots, CNA; 09/14/2020 10:53 AM) Arthritis Father, Mother.  Other Problems David Patrick. Adeliz Tonkinson, MD; 09/14/2020 12:30 PM) No pertinent past medical history     Review of Systems [facility] Gwinner CNA; 09/14/2020 10:53 AM) General Not Present- Appetite Loss, Chills, Fatigue, Fever, Night Sweats, Weight Gain and Weight Loss. Skin Not Present- Change in Wart/Mole, Dryness, Hives, Jaundice, New Lesions, Non-Healing Wounds, Rash and Ulcer. HEENT Not Present- Earache, Hearing Loss, Hoarseness, Nose Bleed, Oral Ulcers, Ringing in the Ears, Seasonal Allergies, Sinus Pain, Sore Throat, Visual Disturbances, Wears glasses/contact lenses and Yellow  Eyes. Respiratory Not Present- Bloody sputum, Chronic Cough, Difficulty Breathing, Snoring and Wheezing. Breast Not Present- Breast Mass, Breast Pain, Nipple Discharge and Skin Changes. Musculoskeletal Not Present- Back Pain, Joint Pain, Joint Stiffness, Muscle Pain, Muscle Weakness and Swelling of Extremities. Neurological Not Present- Decreased Memory, Fainting, Headaches, Numbness, Seizures, Tingling, Tremor, Trouble walking and Weakness.  Vitals (Donyelle Alston CNA; 09/14/2020 10:55 AM) 09/14/2020 10:54 AM Weight: 175.5 lb Height: 71in Body Surface Area: 1.99 m Body Mass Index: 24.48 kg/m  Temp.: 97.14F  Pulse: 76 (Regular)  P.OX: 95% (Room air) BP: 140/80(Sitting, Left Arm, Standard)        Physical Exam Molli Hazard K. Ashtin Rosner MD; 09/14/2020 12:31 PM)  The physical exam findings are as follows: Note:Constitutional: WDWN in NAD, conversant, no obvious deformities; resting comfortably Eyes: Pupils equal, round; sclera anicteric; moist conjunctiva; no lid lag HENT: Oral mucosa moist; good dentition Neck: No masses palpated, trachea midline; no thyromegaly Lungs: CTA bilaterally; normal respiratory effort CV: Regular rate and rhythm; no murmurs; extremities well-perfused with no edema Abd: +bowel sounds, soft, non-tender, no palpable organomegaly; small hernia at the upper edge of the umbilicus that is mildly tender. Not completely reducible. It enlarges minimally with Valsalva. Musc: Normal gait; no apparent clubbing or cyanosis in extremities Lymphatic: No palpable cervical or axillary lymphadenopathy Skin: Warm, dry; no sign of jaundice Psychiatric - alert and oriented x 4; calm mood and affect    Assessment & Plan Molli Hazard K. Ethell Blatchford MD; 09/14/2020 11:10 AM)  UMBILICAL HERNIA WITHOUT OBSTRUCTION OR GANGRENE (K42.9)  Current Plans Schedule for Surgery - Umbilical hernia repair. The surgical procedure has been discussed with the patient. Potential risks, benefits,  alternative treatments, and expected outcomes have been explained. All of the patient's questions at this time have been  answered. The likelihood of reaching the patient's treatment goal is good. The patient understand the proposed surgical procedure and wishes to proceed.  David Patrick. Corliss Skains, MD, [facility]  General/ Trauma Surgery   09/14/2020 12:31 PM

## 2020-10-16 ENCOUNTER — Encounter (HOSPITAL_BASED_OUTPATIENT_CLINIC_OR_DEPARTMENT_OTHER): Payer: Self-pay | Admitting: Surgery

## 2020-10-16 ENCOUNTER — Other Ambulatory Visit: Payer: Self-pay

## 2020-10-20 ENCOUNTER — Other Ambulatory Visit (HOSPITAL_COMMUNITY): Payer: BC Managed Care – PPO

## 2020-10-23 ENCOUNTER — Ambulatory Visit (HOSPITAL_BASED_OUTPATIENT_CLINIC_OR_DEPARTMENT_OTHER): Admission: RE | Admit: 2020-10-23 | Payer: BC Managed Care – PPO | Source: Home / Self Care | Admitting: Surgery

## 2020-10-23 HISTORY — DX: Unspecified tear of unspecified meniscus, current injury, unspecified knee, initial encounter: S83.209A

## 2020-10-23 HISTORY — DX: Umbilical hernia without obstruction or gangrene: K42.9

## 2020-10-23 SURGERY — REPAIR, HERNIA, UMBILICAL, ADULT
Anesthesia: General
